# Patient Record
Sex: Female | Born: 1980 | Race: Black or African American | Hispanic: No | Marital: Single | Smoking: Never smoker
Health system: Southern US, Community
[De-identification: ages and names within clinical notes are randomized; demographics above are authoritative.]

## PROBLEM LIST (undated history)

## (undated) HISTORY — PX: TONSILLECTOMY: SUR1361

---

## 2016-01-15 ENCOUNTER — Emergency Department (HOSPITAL_BASED_OUTPATIENT_CLINIC_OR_DEPARTMENT_OTHER): Payer: Self-pay

## 2016-01-15 ENCOUNTER — Encounter (HOSPITAL_BASED_OUTPATIENT_CLINIC_OR_DEPARTMENT_OTHER): Payer: Self-pay

## 2016-01-15 ENCOUNTER — Emergency Department (HOSPITAL_BASED_OUTPATIENT_CLINIC_OR_DEPARTMENT_OTHER)
Admission: EM | Admit: 2016-01-15 | Discharge: 2016-01-15 | Disposition: A | Discharge status: Home / Self Care | Payer: Self-pay | Attending: Emergency Medicine | Admitting: Emergency Medicine

## 2016-01-15 DIAGNOSIS — R0789 Other chest pain: Secondary | ICD-10-CM | POA: Insufficient documentation

## 2016-01-15 DIAGNOSIS — R0602 Shortness of breath: Secondary | ICD-10-CM | POA: Insufficient documentation

## 2016-01-15 LAB — BASIC METABOLIC PANEL
ANION GAP: 6 (ref 5–15)
BUN: 11 mg/dL (ref 6–20)
CHLORIDE: 109 mmol/L (ref 101–111)
CO2: 22 mmol/L (ref 22–32)
Calcium: 8.7 mg/dL — ABNORMAL LOW (ref 8.9–10.3)
Creatinine, Ser: 0.77 mg/dL (ref 0.44–1.00)
GFR calc non Af Amer: >60 mL/min (ref >60)
Glucose, Bld: 105 mg/dL — ABNORMAL HIGH (ref 65–99)
Potassium: 4.2 mmol/L (ref 3.5–5.1)
Sodium: 137 mmol/L (ref 135–145)

## 2016-01-15 LAB — CBC WITH DIFFERENTIAL/PLATELET
BASOS PCT: 0 %
Basophils Absolute: 0 10*3/uL (ref 0.0–0.1)
Eosinophils Absolute: 0.1 10*3/uL (ref 0.0–0.7)
Eosinophils Relative: 1 %
HEMATOCRIT: 33.3 % — AB (ref 36.0–46.0)
HEMOGLOBIN: 11.1 g/dL — AB (ref 12.0–15.0)
LYMPHS ABS: 2 10*3/uL (ref 0.7–4.0)
Lymphocytes Relative: 32 %
MCH: 29.1 pg (ref 26.0–34.0)
MCHC: 33.3 g/dL (ref 30.0–36.0)
MCV: 87.4 fL (ref 78.0–100.0)
MONOS PCT: 12 %
Monocytes Absolute: 0.7 10*3/uL (ref 0.1–1.0)
NEUTROS ABS: 3.4 10*3/uL (ref 1.7–7.7)
NEUTROS PCT: 55 %
Platelets: 258 10*3/uL (ref 150–400)
RBC: 3.81 MIL/uL — AB (ref 3.87–5.11)
RDW: 13 % (ref 11.5–15.5)
WBC: 6.2 10*3/uL (ref 4.0–10.5)

## 2016-01-15 LAB — D-DIMER, QUANTITATIVE: D-Dimer, Quant: <0.27 ug/mL-FEU (ref 0.00–0.50)

## 2016-01-15 LAB — TROPONIN I

## 2016-01-15 MED ORDER — KETOROLAC TROMETHAMINE 30 MG/ML IJ SOLN
30.0000 mg | Freq: Once | INTRAMUSCULAR | Status: AC
  Administered 2016-01-15: 30 mg via INTRAVENOUS
  Filled 2016-01-15: qty 1

## 2016-01-15 NOTE — ED Notes (Signed)
Provider at bedside

## 2016-01-15 NOTE — ED Provider Notes (Signed)
[facility]   CSN: [id number] Arrival date & time: 01/15/16  4098  First Provider Contact:  First MD Initiated Contact with Patient 01/15/16 1013        History   Chief Complaint Chief Complaint  Patient presents with  . Chest Pain    HPI Tammy Boone is a 35 y.o. female.  Patient is a 35 year old female with no past medical history who presents with chest pain. She describes as a sharp pain in the center of the chest that radiates to the right side. She has some associated shortness of breath. The pain started last night and has continued through this morning. Last about 5-10 minutes each episode. She denies any nausea vomiting or diaphoresis. No leg pain or swelling. The pain is nonexertional and nonpleuritic. Denies any cough or chest congestion. No recent illnesses. No family history of early heart disease. She denies any past cardiac disease. No history of blood clots. She's not on birth control pills. No recent immobilization.      History reviewed. No pertinent past medical history.  There are no active problems to display for this patient.   Past Surgical History:  Procedure Laterality Date  . CESAREAN SECTION    . TONSILLECTOMY      OB History    No data available       Home Medications    Prior to Admission medications   Not on File    Family History History reviewed. No pertinent family history.  Social History Social History  Substance Use Topics  . Smoking status: Never Smoker  . Smokeless tobacco: Never Used  . Alcohol use Yes     Comment: socially     Allergies   Review of patient's allergies indicates no known allergies.   Review of Systems Review of Systems  Constitutional: Negative for chills, diaphoresis, fatigue and fever.  HENT: Negative for congestion, rhinorrhea and sneezing.   Eyes: Negative.   Respiratory: Positive for shortness of breath. Negative for cough and chest tightness.     Cardiovascular: Positive for chest pain. Negative for leg swelling.  Gastrointestinal: Negative for abdominal pain, blood in stool, diarrhea, nausea and vomiting.  Genitourinary: Negative for difficulty urinating, flank pain, frequency and hematuria.  Musculoskeletal: Negative for arthralgias and back pain.  Skin: Negative for rash.  Neurological: Negative for dizziness, speech difficulty, weakness, numbness and headaches.     Physical Exam Updated Vital Signs BP 130/74 (BP Location: Right Arm)   Pulse 92   Temp 98.5 F (36.9 C) (Oral)   Resp 18   Ht  (1.575 m)   Wt 240 lb (108.9 kg)   LMP 12/15/2015   SpO2 100%   BMI 43.90 kg/m   Physical Exam  Constitutional: She is oriented to person, place, and time. She appears well-developed and well-nourished.  HENT:  Head: Normocephalic and atraumatic.  Eyes: Pupils are equal, round, and reactive to light.  Neck: Normal range of motion. Neck supple.  Cardiovascular: Normal rate, regular rhythm and normal heart sounds.   Pulmonary/Chest: Effort normal and breath sounds normal. No respiratory distress. She has no wheezes. She has no rales. She exhibits tenderness (tenderness to right anterior chest wall).  Abdominal: Soft. Bowel sounds are normal. There is no tenderness. There is no rebound and no guarding.  Musculoskeletal: Normal range of motion. She exhibits no edema.  No edema or calf tenderness  Lymphadenopathy:    She has no cervical adenopathy.  Neurological: She is alert  and oriented to person, place, and time.  Skin: Skin is warm and dry. No rash noted.  Psychiatric: She has a normal mood and affect.     ED Treatments / Results  Labs (all labs ordered are listed, but only abnormal results are displayed) Results for orders placed or performed during the hospital encounter of 01/15/16  Basic metabolic panel  Result Value Ref Range   Sodium 137 135 - 145 mmol/L   Potassium 4.2 3.5 - 5.1 mmol/L   Chloride 109 101 -  111 mmol/L   CO2 22 22 - 32 mmol/L   Glucose, Bld 105 (H) 65 - 99 mg/dL   BUN 11 6 - 20 mg/dL   Creatinine, Ser 1.61 0.44 - 1.00 mg/dL   Calcium 8.7 (L) 8.9 - 10.3 mg/dL   GFR calc non Af Amer >60 >60 mL/min   GFR calc Af Amer >60 >60 mL/min   Anion gap 6 5 - 15  CBC with Differential  Result Value Ref Range   WBC 6.2 4.0 - 10.5 K/uL   RBC 3.81 (L) 3.87 - 5.11 MIL/uL   Hemoglobin 11.1 (L) 12.0 - 15.0 g/dL   HCT 09.6 (L) 04.5 - 40.9 %   MCV 87.4 78.0 - 100.0 fL   MCH 29.1 26.0 - 34.0 pg   MCHC 33.3 30.0 - 36.0 g/dL   RDW 81.1 91.4 - 78.2 %   Platelets 258 150 - 400 K/uL   Neutrophils Relative % 55 %   Neutro Abs 3.4 1.7 - 7.7 K/uL   Lymphocytes Relative 32 %   Lymphs Abs 2.0 0.7 - 4.0 K/uL   Monocytes Relative 12 %   Monocytes Absolute 0.7 0.1 - 1.0 K/uL   Eosinophils Relative 1 %   Eosinophils Absolute 0.1 0.0 - 0.7 K/uL   Basophils Relative 0 %   Basophils Absolute 0.0 0.0 - 0.1 K/uL  D-dimer, quantitative  Result Value Ref Range   D-Dimer, Quant <0.27 0.00 - 0.50 ug/mL-FEU  Troponin I  Result Value Ref Range   Troponin I <0.03 <0.03 ng/mL   Dg Chest 2 View  Result Date: 01/15/2016 CLINICAL DATA:  35 year old female with a history of shortness of breath and history of bronchitis. EXAM: CHEST  2 VIEW COMPARISON:  None. FINDINGS: Cardiopulmonary silhouette within normal limits. No confluent airspace disease or pneumothorax. No pleural effusion. Mild bronchial wall thickening of the central airways in the bilateral hila. No displaced fracture. IMPRESSION: Mild central airway thickening, may reflect reactive airway disease or potentially bronchitis. No evidence of lobar pneumonia. Signed, Yvone Neu. Loreta Ave, DO [facility] Electronically Signed   By: Gilmer Mor D.O.   On: 01/15/2016 11:31     EKG  EKG Interpretation  Date/Time:  Saturday January 15 2016 10:12:48 EDT Ventricular Rate:  95 PR Interval:    QRS  Duration: 96 QT Interval:  355 QTC Calculation: 447 R Axis:   33 Text Interpretation:  Sinus rhythm Low voltage, precordial leads No old tracing to compare Confirmed by Amos Gaber  MD, Vanice Rappa ([id number]) on 01/15/2016 10:17:20 AM       Radiology Dg Chest 2 View  Result Date: 01/15/2016 CLINICAL DATA:  35 year old female with a history of shortness of breath and history of bronchitis. EXAM: CHEST  2 VIEW COMPARISON:  None. FINDINGS: Cardiopulmonary silhouette within normal limits. No confluent airspace disease or pneumothorax. No pleural effusion. Mild bronchial wall thickening of the central airways in the bilateral hila. No displaced fracture.  IMPRESSION: Mild central airway thickening, may reflect reactive airway disease or potentially bronchitis. No evidence of lobar pneumonia. Signed, Yvone Neu. Loreta Ave, DO [facility] Electronically Signed   By: Gilmer Mor D.O.   On: 01/15/2016 11:31    Procedures Procedures (including critical care time)  Medications Ordered in ED Medications  ketorolac (TORADOL) 30 MG/ML injection 30 mg (30 mg Intravenous Given 01/15/16 1058)     Initial Impression / Assessment and Plan / ED Course  I have reviewed the triage vital signs and the nursing notes.  Pertinent labs & imaging results that were available during my care of the patient were reviewed by me and considered in my medical decision making (see chart for details).  Clinical Course    Patient presents with right-sided chest pain. It's reproducible on palpation. She had improvement with Toradol. Her chest x-ray shows possible bronchitis but she doesn't really have symptoms that would be consistent with bronchitis. There is no cough. Her lungs are clear on exam. She has no hypoxia or tachypnea. Her d-dimer is normal and she has no other suggestions of pulmonary embolus. Her symptoms don't sound consistent with acute coronary syndrome. There is no  ischemic changes on EKG. Her troponin is negative. She was discharged home in good condition. She has a low risk heart score. She was encouraged to follow-up with her primary care provider if her symptoms continue or return here as needed if she has any worsening symptoms.  Final Clinical Impressions(s) / ED Diagnoses   Final diagnoses:  Chest wall pain    New Prescriptions New Prescriptions   No medications on file     Rolan Bucco, MD 01/15/16 1236

## 2016-01-15 NOTE — ED Notes (Signed)
Patient transported to X-ray 

## 2016-01-15 NOTE — ED Triage Notes (Signed)
Patient presents to ED mid-sternal and right breast that began last night. Pain is sharp and "comes and goes". Patient has not taken anything for the pain and denies a hx of this pain.

## 2016-01-15 NOTE — ED Notes (Signed)
Pt on heart monitor 

## 2016-01-15 NOTE — ED Notes (Signed)
First EKG had Rt & Lt arm leads revered and RN Shanda Bumps & MD [facility] were made aware . No charge for the second EKG done.

## 2017-07-28 IMAGING — DX DG CHEST 2V
2 series · 2 of 2 positions shown · non-contrast
Comparison: None.

CLINICAL DATA: 35-year-old female with a history of shortness of
breath and history of bronchitis.

EXAM:
CHEST  2 VIEW

[chest pa]
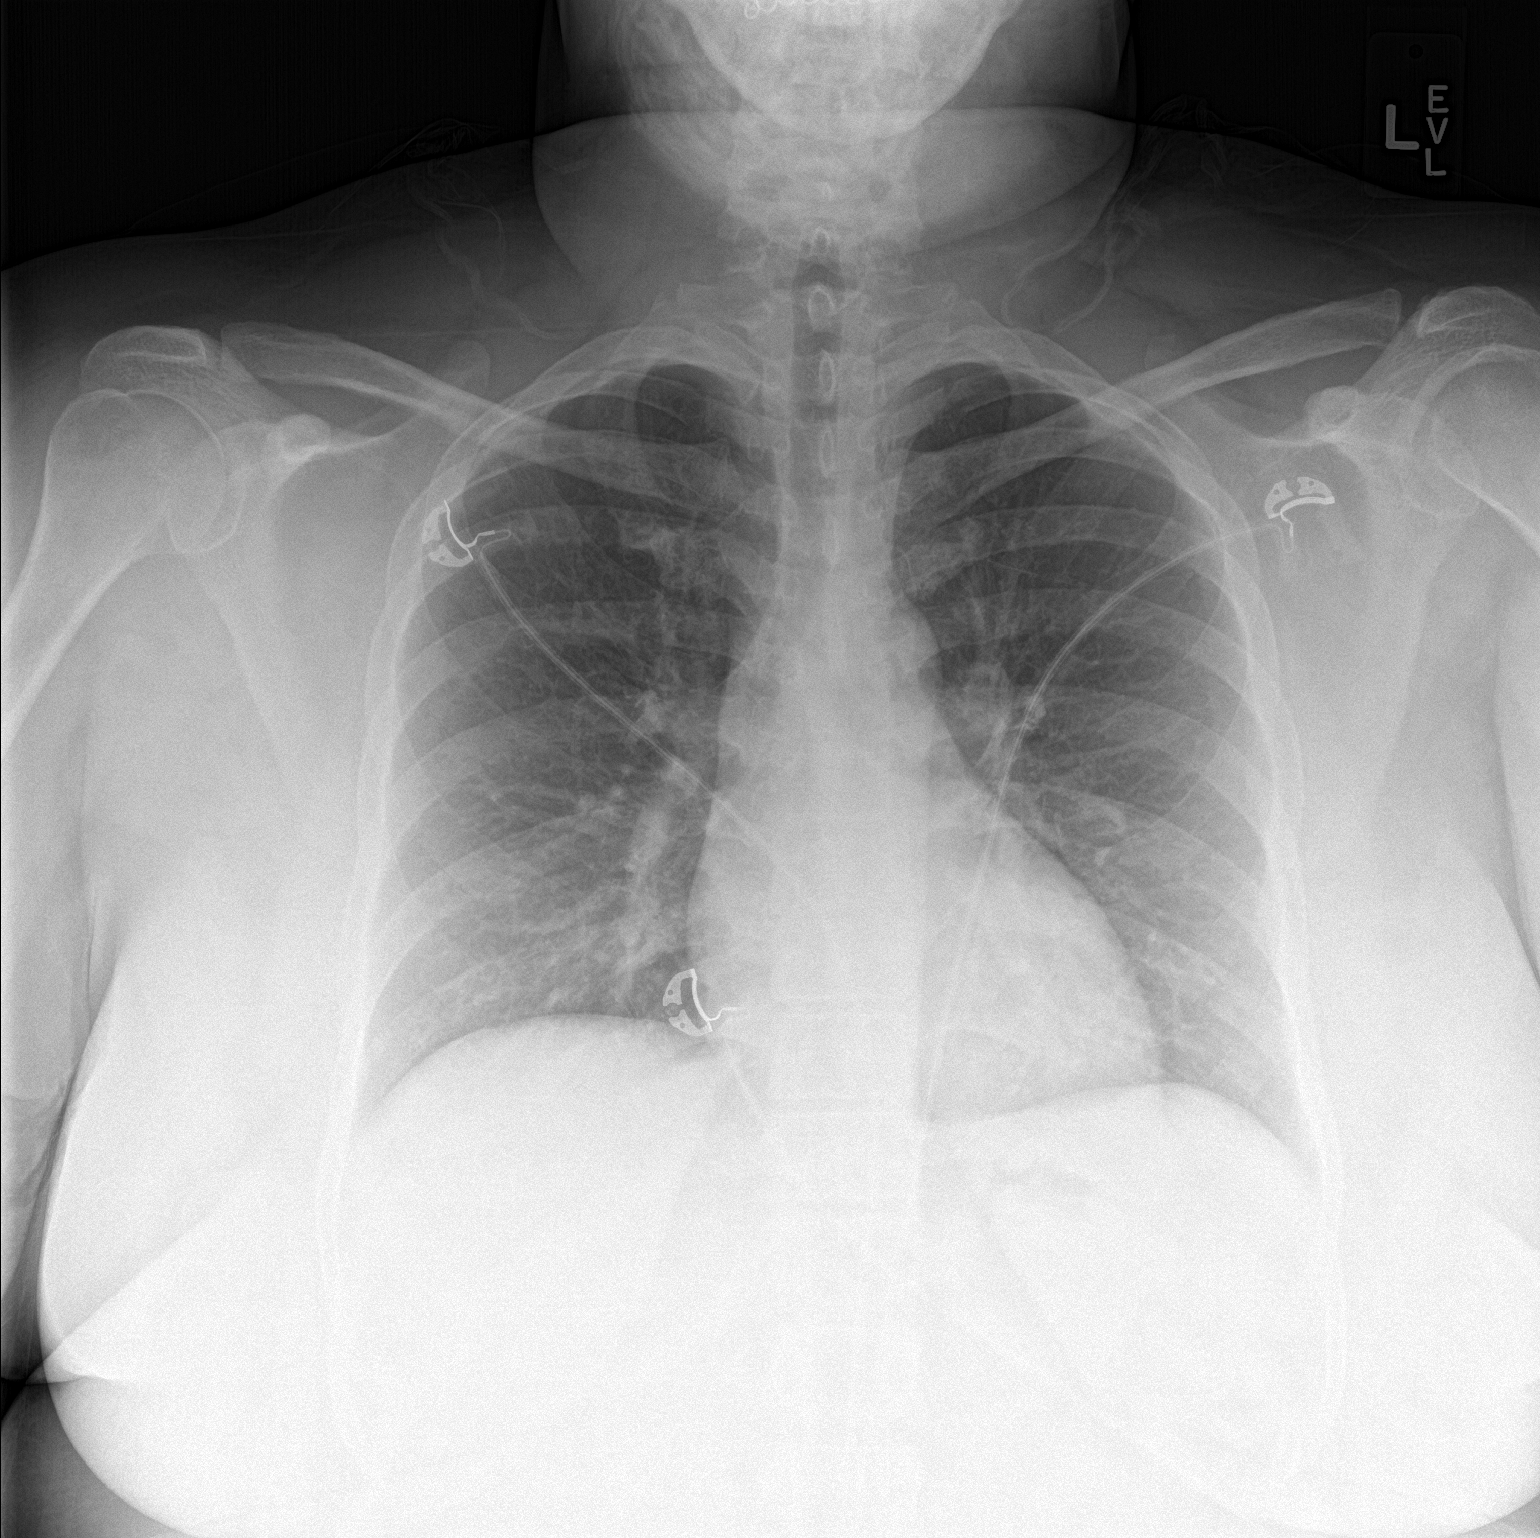

[chest lat]
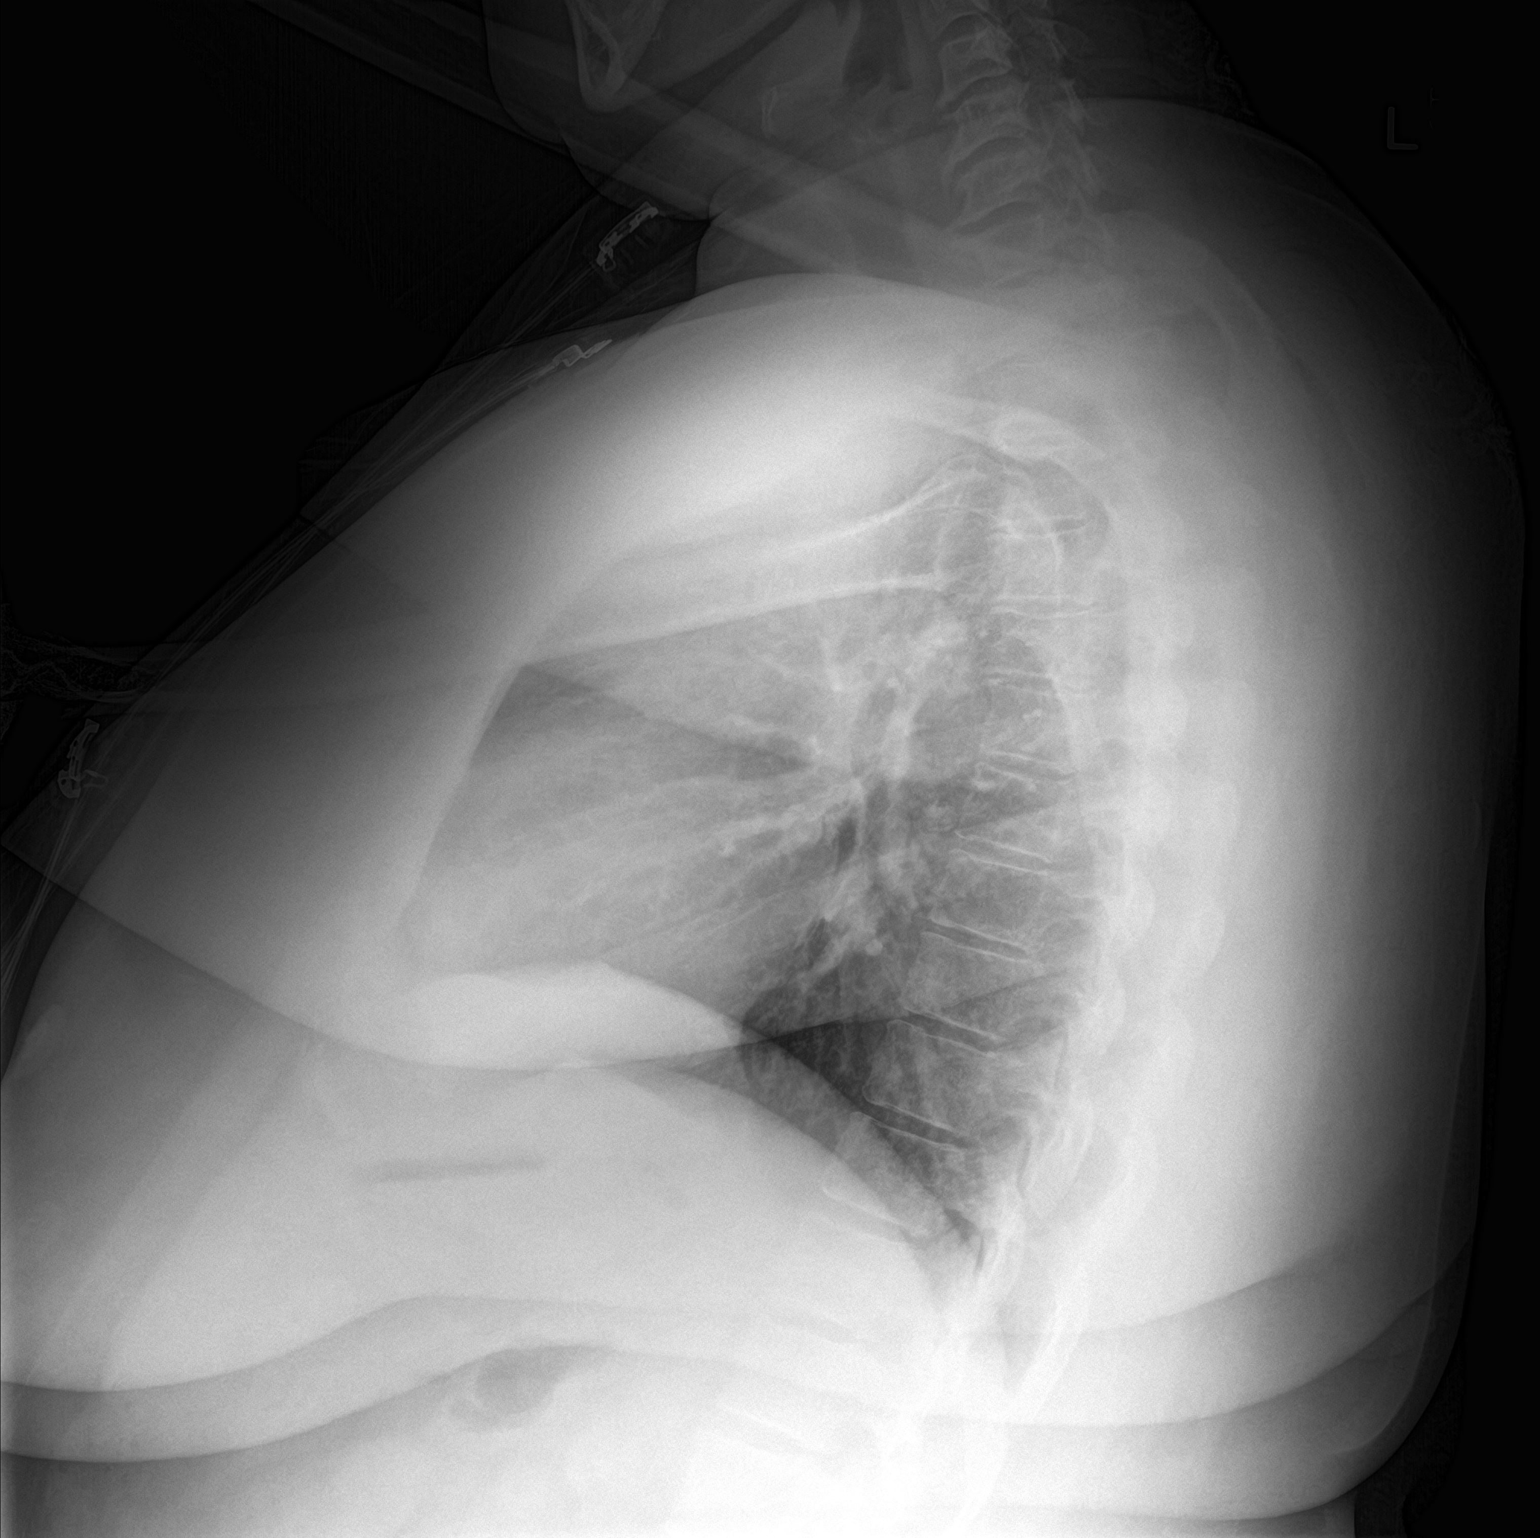

[2 of 2 positions shown; findings below may reference images not displayed]

FINDINGS: Cardiopulmonary silhouette within normal limits.

No confluent airspace disease or pneumothorax. No pleural effusion.
Mild bronchial wall thickening of the central airways in the
bilateral hila.

No displaced fracture.
IMPRESSION: Mild central airway thickening, may reflect reactive airway disease
or potentially bronchitis. No evidence of lobar pneumonia.

## 2021-03-15 ENCOUNTER — Emergency Department
Admission: EM | Admit: 2021-03-15 | Discharge: 2021-03-15 | Disposition: A | Discharge status: Home / Self Care | Payer: Self-pay | Source: Home / Self Care

## 2021-03-15 ENCOUNTER — Encounter: Payer: Self-pay | Admitting: Family Medicine

## 2021-03-15 DIAGNOSIS — R52 Pain, unspecified: Secondary | ICD-10-CM

## 2021-03-15 DIAGNOSIS — R059 Cough, unspecified: Secondary | ICD-10-CM

## 2021-03-15 DIAGNOSIS — J309 Allergic rhinitis, unspecified: Secondary | ICD-10-CM

## 2021-03-15 MED ORDER — BENZONATATE 200 MG PO CAPS
200.0000 mg | ORAL_CAPSULE | Freq: Three times a day (TID) | ORAL | 0 refills | Status: AC | PRN
Start: 2021-03-15 — End: 2021-03-22

## 2021-03-15 MED ORDER — FEXOFENADINE HCL 180 MG PO TABS
180.0000 mg | ORAL_TABLET | Freq: Every day | ORAL | 0 refills | Status: AC
Start: 2021-03-15 — End: 2021-03-30

## 2021-03-15 NOTE — Discharge Instructions (Addendum)
Advised/instructed patient may use Tessalon Perles daily, as needed for cough.  Advised patient may take Allegra daily for the next 7 days, then as needed for concurrent postnasal drainage/postnasal drip.  Advised patient we will follow-up with her once COVID-19/flu A&B results return.  Encouraged patient increase daily water intake while taking these medications.

## 2021-03-15 NOTE — ED Triage Notes (Signed)
Patient c/o body aches, headache, runny nose, non-productive cough since yesterday.  Patient has taken Tylenol and Thera-Flu.  Patient is not vaccinated for COVID.

## 2021-03-15 NOTE — ED Provider Notes (Signed)
[facility]    CSN: [id number] Arrival date & time: 03/15/21  1517      History   Chief Complaint Chief Complaint  Patient presents with   Nasal Congestion    HPI Tammy Boone is a 40 y.o. female.   HPI 40 year old female presents with body aches, headache, runny nose, nonproductive cough since yesterday.  Patient is taking Tylenol and TheraFlu.  Patient is not vaccinated for COVID-19.  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  History reviewed. No pertinent past medical history.  There are no problems to display for this patient.   Past Surgical History:  Procedure Laterality Date   CESAREAN SECTION     TONSILLECTOMY      OB History   No obstetric history on file.      Home Medications    Prior to Admission medications   Medication Sig Start Date End Date Taking? Authorizing Provider  benzonatate (TESSALON) 200 MG capsule Take 1 capsule (200 mg total) by mouth 3 (three) times daily as needed for up to 7 days for cough. 03/15/21 03/22/21 Yes Trevor Iha, FNP  fexofenadine [facility] ALLERGY) 180 MG tablet Take 1 tablet (180 mg total) by mouth daily for 15 days. 03/15/21 03/30/21 Yes Trevor Iha, FNP    Family History History reviewed. No pertinent family history.  Social History Social History   Tobacco Use   Smoking status: Never   Smokeless tobacco: Never  Substance Use Topics   Alcohol use: Yes    Comment: socially   Drug use: No     Allergies   Patient has no known allergies.   Review of Systems Review of Systems  HENT:  Positive for rhinorrhea.   Respiratory:  Positive for cough.   Musculoskeletal:  Positive for myalgias.  All other systems reviewed and are negative.   Physical Exam Triage Vital Signs ED Triage Vitals [03/15/21 1540]  Enc Vitals Group     BP (!) 133/93     Pulse Rate 87     Resp      Temp 98.9 F (37.2 C)     Temp Source Oral      SpO2 97 %     Weight      Height      Head Circumference      Peak Flow      Pain Score      Pain Loc      Pain Edu?      Excl. in GC?    No data found.  Updated Vital Signs BP (!) 133/93 (BP Location: Left Arm)   Pulse 87   Temp 98.9 F (37.2 C) (Oral)   Ht 5\' 2"  (1.575 m)   Wt 262 lb (118.8 kg)   LMP 02/28/2021   SpO2 97%   BMI 47.92 kg/m       Physical Exam Vitals and nursing note reviewed.  Constitutional:      General: She is not in acute distress.    Appearance: Normal appearance. She is obese. She is not ill-appearing.  HENT:     Head: Normocephalic and atraumatic.     Right Ear: Tympanic membrane, ear canal and external ear normal.     Left Ear: Tympanic membrane, ear canal and external ear normal.     Mouth/Throat:     Mouth: Mucous membranes are moist.     Pharynx: Oropharynx is clear.     Comments:  Moderate amount of clear drainage of posterior oropharynx noted Eyes:     Extraocular Movements: Extraocular movements intact.     Conjunctiva/sclera: Conjunctivae normal.     Pupils: Pupils are equal, round, and reactive to light.  Cardiovascular:     Rate and Rhythm: Normal rate and regular rhythm.     Pulses: Normal pulses.     Heart sounds: Normal heart sounds.  Pulmonary:     Effort: Pulmonary effort is normal.     Breath sounds: Normal breath sounds. No wheezing, rhonchi or rales.     Comments: Infrequent nonproductive cough noted on exam Musculoskeletal:        General: Normal range of motion.     Cervical back: Normal range of motion and neck supple.  Skin:    General: Skin is warm and dry.  Neurological:     General: No focal deficit present.     Mental Status: She is alert and oriented to person, place, and time. Mental status is at baseline.  Psychiatric:        Mood and Affect: Mood normal.        Behavior: Behavior normal.        Thought Content: Thought content normal.     UC Treatments / Results  Labs (all labs ordered are  listed, but only abnormal results are displayed) Labs Reviewed  COVID-19, FLU A+B NAA    EKG   Radiology No results found.  Procedures Procedures (including critical care time)  Medications Ordered in UC Medications - No data to display  Initial Impression / Assessment and Plan / UC Course  I have reviewed the triage vital signs and the nursing notes.  Pertinent labs & imaging results that were available during my care of the patient were reviewed by me and considered in my medical decision making (see chart for details).     MDM: 1.  Cough-Rx'd Tessalon Perles; 2.  Allergic rhinitis-Rx'd Allegra; 3.  Generalized body aches-COVID-19 flu A&B ordered. Advised/instructed patient may use Tessalon Perles daily, as needed for cough.  Advised patient may take Allegra daily for the next 7 days, then as needed for concurrent postnasal drainage/postnasal drip.  Advised patient we will follow-up with her once COVID-19/flu A&B results return.  Encouraged patient increase daily water intake while taking these medications.  Work note provided to patient prior to discharge. Patient discharged home, hemodynamically stable.  Final Clinical Impressions(s) / UC Diagnoses   Final diagnoses:  Cough, unspecified type  Allergic rhinitis, unspecified seasonality, unspecified trigger  Generalized body aches     Discharge Instructions      Advised/instructed patient may use Tessalon Perles daily, as needed for cough.  Advised patient may take Allegra daily for the next 7 days, then as needed for concurrent postnasal drainage/postnasal drip.  Advised patient we will follow-up with her once COVID-19/flu A&B results return.  Encouraged patient increase daily water intake while taking these medications.     ED Prescriptions     Medication Sig Dispense Auth. Provider   benzonatate (TESSALON) 200 MG capsule Take 1 capsule (200 mg total) by mouth 3 (three) times daily as needed for up to 7 days for  cough. 30 capsule Trevor Iha, FNP   fexofenadine [facility] ALLERGY) 180 MG tablet Take 1 tablet (180 mg total) by mouth daily for 15 days. 15 tablet Trevor Iha, FNP      PDMP not reviewed this encounter.   Trevor Iha, FNP 03/15/21 1605

## 2021-03-17 LAB — COVID-19, FLU A+B NAA
Influenza A, NAA: NOT DETECTED
Influenza B, NAA: NOT DETECTED
SARS-CoV-2, NAA: NOT DETECTED
